# Patient Record
Sex: Female | Born: 1960 | Race: White | Hispanic: No | Marital: Married | Smoking: Never smoker
Health system: Southern US, Community
[De-identification: ages and names within clinical notes are randomized; demographics above are authoritative.]

## PROBLEM LIST (undated history)

## (undated) DIAGNOSIS — E882 Lipomatosis, not elsewhere classified: Secondary | ICD-10-CM

## (undated) HISTORY — PX: BREAST BIOPSY: SHX20

---

## 1997-12-28 ENCOUNTER — Emergency Department (HOSPITAL_COMMUNITY): Admission: EM | Admit: 1997-12-28 | Discharge: 1997-12-28 | Payer: Self-pay

## 1998-02-23 ENCOUNTER — Other Ambulatory Visit: Admission: RE | Admit: 1998-02-23 | Discharge: 1998-02-23 | Payer: Self-pay | Admitting: *Deleted

## 1998-08-09 ENCOUNTER — Inpatient Hospital Stay (HOSPITAL_COMMUNITY): Admission: AD | Admit: 1998-08-09 | Discharge: 1998-08-09 | Payer: Self-pay | Admitting: Obstetrics and Gynecology

## 1998-08-10 ENCOUNTER — Inpatient Hospital Stay (HOSPITAL_COMMUNITY): Admission: AD | Admit: 1998-08-10 | Discharge: 1998-08-12 | Payer: Self-pay | Admitting: Obstetrics and Gynecology

## 1999-02-12 ENCOUNTER — Other Ambulatory Visit: Admission: RE | Admit: 1999-02-12 | Discharge: 1999-02-12 | Payer: Self-pay | Admitting: Obstetrics and Gynecology

## 2000-01-10 ENCOUNTER — Ambulatory Visit (HOSPITAL_COMMUNITY): Admission: RE | Admit: 2000-01-10 | Discharge: 2000-01-10 | Payer: Self-pay | Admitting: Internal Medicine

## 2000-01-10 ENCOUNTER — Encounter: Payer: Self-pay | Admitting: Internal Medicine

## 2000-03-31 ENCOUNTER — Other Ambulatory Visit: Admission: RE | Admit: 2000-03-31 | Discharge: 2000-03-31 | Payer: Self-pay | Admitting: Obstetrics and Gynecology

## 2001-03-29 ENCOUNTER — Encounter: Payer: Self-pay | Admitting: Obstetrics and Gynecology

## 2001-03-29 ENCOUNTER — Ambulatory Visit (HOSPITAL_COMMUNITY): Admission: RE | Admit: 2001-03-29 | Discharge: 2001-03-29 | Payer: Self-pay | Admitting: Obstetrics and Gynecology

## 2001-04-21 ENCOUNTER — Encounter (INDEPENDENT_AMBULATORY_CARE_PROVIDER_SITE_OTHER): Payer: Self-pay

## 2001-04-21 ENCOUNTER — Observation Stay (HOSPITAL_COMMUNITY): Admission: AD | Admit: 2001-04-21 | Discharge: 2001-04-22 | Payer: Self-pay | Admitting: Obstetrics and Gynecology

## 2001-05-06 ENCOUNTER — Other Ambulatory Visit: Admission: RE | Admit: 2001-05-06 | Discharge: 2001-05-06 | Payer: Self-pay | Admitting: Obstetrics and Gynecology

## 2001-10-04 ENCOUNTER — Encounter: Payer: Self-pay | Admitting: Obstetrics and Gynecology

## 2001-10-04 ENCOUNTER — Ambulatory Visit (HOSPITAL_COMMUNITY): Admission: RE | Admit: 2001-10-04 | Discharge: 2001-10-04 | Payer: Self-pay | Admitting: Obstetrics and Gynecology

## 2001-10-06 ENCOUNTER — Encounter (INDEPENDENT_AMBULATORY_CARE_PROVIDER_SITE_OTHER): Payer: Self-pay

## 2001-10-06 ENCOUNTER — Inpatient Hospital Stay (HOSPITAL_COMMUNITY): Admission: AD | Admit: 2001-10-06 | Discharge: 2001-10-08 | Payer: Self-pay | Admitting: Obstetrics and Gynecology

## 2001-10-15 ENCOUNTER — Encounter: Payer: Self-pay | Admitting: Internal Medicine

## 2001-10-15 ENCOUNTER — Encounter: Admission: RE | Admit: 2001-10-15 | Discharge: 2001-10-15 | Payer: Self-pay | Admitting: Internal Medicine

## 2002-09-13 ENCOUNTER — Other Ambulatory Visit: Admission: RE | Admit: 2002-09-13 | Discharge: 2002-09-13 | Payer: Self-pay | Admitting: Obstetrics and Gynecology

## 2003-11-13 ENCOUNTER — Other Ambulatory Visit: Admission: RE | Admit: 2003-11-13 | Discharge: 2003-11-13 | Payer: Self-pay | Admitting: Obstetrics and Gynecology

## 2003-11-21 ENCOUNTER — Encounter: Admission: RE | Admit: 2003-11-21 | Discharge: 2003-11-21 | Payer: Self-pay | Admitting: Internal Medicine

## 2004-08-15 ENCOUNTER — Other Ambulatory Visit: Admission: RE | Admit: 2004-08-15 | Discharge: 2004-08-15 | Payer: Self-pay | Admitting: Obstetrics and Gynecology

## 2004-10-17 ENCOUNTER — Encounter: Admission: RE | Admit: 2004-10-17 | Discharge: 2004-10-17 | Payer: Self-pay | Admitting: Obstetrics and Gynecology

## 2005-03-19 ENCOUNTER — Other Ambulatory Visit: Admission: RE | Admit: 2005-03-19 | Discharge: 2005-03-19 | Payer: Self-pay | Admitting: Obstetrics and Gynecology

## 2005-03-24 ENCOUNTER — Ambulatory Visit (HOSPITAL_COMMUNITY): Admission: RE | Admit: 2005-03-24 | Discharge: 2005-03-24 | Payer: Self-pay | Admitting: General Surgery

## 2005-03-24 ENCOUNTER — Encounter (INDEPENDENT_AMBULATORY_CARE_PROVIDER_SITE_OTHER): Payer: Self-pay | Admitting: *Deleted

## 2006-06-03 ENCOUNTER — Emergency Department (HOSPITAL_COMMUNITY): Admission: EM | Admit: 2006-06-03 | Discharge: 2006-06-03 | Payer: Self-pay | Admitting: Emergency Medicine

## 2009-01-09 ENCOUNTER — Encounter: Admission: RE | Admit: 2009-01-09 | Discharge: 2009-01-09 | Payer: Self-pay | Admitting: Internal Medicine

## 2010-10-04 NOTE — Op Note (Signed)
[facility]  Patient:    Lindsey Dorsey, Lindsey Dorsey Visit Number: [id number] MRN: [id number]          Service Type: MED Location: 9300 [id number] Attending Physician:  Miguel Aschoff Dictated by:   Miguel Aschoff, M.D. Proc. Date: 10/06/01 Admit Date:  10/06/2001 Discharge Date: 10/08/2001                             Operative Report  PREOPERATIVE DIAGNOSIS:       Retained products of conception.  POSTOPERATIVE DIAGNOSIS:      Retained products of conception.  OPERATION:                    Suction curettage.  SURGEON:                      Miguel Aschoff, M.D.  ANESTHESIA:                   IV sedation and paracervical block.  COMPLICATIONS:                None.  INDICATIONS:                  The patient is a 50 year old white female gravida 9, para 6-1-1-6, who was approximately [redacted] weeks pregnant, and found to have a fetal demise.  The patient was admitted to undergo evacuation of the uterus using Cytotec.  This was successful with passage of the fetus. However, the placenta was partially retained, and the patient began having heavy bleeding with passage of large clots and loss of approximately one unit of blood.  Because of this, she is being taken now to undergo suction curettage to evacuate the uterus as the bleeding arrested.  DESCRIPTION OF PROCEDURE:     The patient was taken to the operating room and placed in the supine position.  IV sedation was administered without difficulty.  She was prepped and draped in the usual sterile fashion.  The bladder was catheterization.  A speculum was placed in the vaginal vault.  The residual clot in the vagina were removed without difficulty.  The cervix was identified and grasped with a tenaculum, and the cervix was injected with 18 cc of 1% Xylocaine by placing 6 cc at the 12, 4, and 8 oclock positions. The cervix was already widely open and a #10 vacuum curet was placed into the uterine cavity and contents were  removed without difficulty.  This yielded a moderate amount of retained placental tissue.  Following this, sharp curettage was carried out with only a small amount of additional tissue being obtained.  Then, a final pass was made with the vacuum curet.  With the uterus being empty, the procedure was completed.  The estimated blood loss for the procedure was approximately 75 cc.  However, the estimated blood loss from the hemorrhage due to the retained placenta prior to the OR was between 600-700 cc.  The patient will be observed overnight.  If she is stable, she is to be discharged to home. Dictated by:   Miguel Aschoff, M.D. Attending Physician:  Miguel Aschoff DD:  10/06/01 TD:  10/09/01 Job: [id number]

## 2010-10-04 NOTE — Discharge Summary (Signed)
[facility]  Patient:    Lindsey Dorsey, Lindsey Dorsey Visit Number: [id number] MRN: [id number]          Service Type: MED Location: [phone] Attending Physician:  Miguel Aschoff Dictated by:   Miguel Aschoff, M.D. Admit Date:  10/06/2001 Discharge Date: 10/08/2001                             Discharge Summary  ADMISSION DIAGNOSIS:             Fetal demise at 14 weeks.  OPERATIONS AND PROCEDURES:       Cytotec induction of labor followed by uterine curettage for routine products of conception.  HISTORY AND HOSPITAL COURSE:     The patient is a 50 year old white female gravida 80, para 6-0-2-6 with last menstrual period of June 24, 2001.  The patient was seen in the office and was noted to have no fetal heart activity. This was confirmed by ultrasound.  Her gestational age was 14 weeks and the patient was admitted to undergo Cytotec evacuation of the uterus for fetal demise.  The patient was admitted and  preoperative studies were obtained. This included admission hemoglobin of 12.5, hematocrit 36.7, white count of 7300.  Cytotec was used and the patient ultimately did pass the fetus without difficulty, however, the placenta was retained for more than 2 hours and in view of the onset of heavy vaginal bleeding it was elected to take the patient to the operating room to perform a D&C of the retained placenta.  This was done without difficulty.  The uterus was evacuated using suctions and curettage and the patient tolerated the procedure well.  The patient was started on antibiotic therapy and observed, there did not appear to be any renewed vaginal bleeding and by Oct 08, 2001 the patient was in satisfactory condition and was able to be discharged home.  DISCHARGE MEDICATIONS:           Medications for home included Motrin 400 mg every 4 hours as needed for pain.  DISCHARGE INSTRUCTIONS:          She is instructed to place nothing in the vagina and to call  if there are any problems such as fever, pain, or heavy bleeding.  LABORATORY DATA:                 The patients blood type was noted to be A positive.  The products of conception were sent for chromosome analysis.   The patient did undergo a TORCH titer and anticardiolipin studies to see if an etiology for the intrauterine fetal death could be determined.  These studies are pending. Dictated by:   Miguel Aschoff, M.D. Attending Physician:  Miguel Aschoff DD:  10/22/01 TD:  10/25/01 Job: [id number]

## 2010-10-04 NOTE — Op Note (Signed)
NAME:  Lindsey Dorsey, Lindsey Dorsey              ACCOUNT NO.:  [id number]   MEDICAL RECORD NO.:  [id number]          PATIENT TYPE:  AMB   LOCATION:  DAY                          FACILITY:  [facility]   PHYSICIAN:  Angelia Mould. 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   OPERATION PERFORMED:  Biopsy left triceps muscle with overlying skin and  subcutaneous adipose tissue.   SURGEON:  Angelia Mould. Derrell Lolling, M.D.   OPERATIVE INDICATIONS:  This is a 50 year old white female who has symptoms  of myalgias and multiple painful angiolipomas. She has been given the  diagnosis of Dercum's disease at [facility] dermatology clinic. She has  other rheumatologic symptoms. A consultation has been obtained with Dr.  Lyndon Code at the [facility]  in [place] [facility] who is concerned  that the patient may also have systemic rheumatologic disorder. She  requested a full-thickness biopsy of skin, subcutaneous tissue and muscle  from the triceps area. She requested either a punch biopsy or an excisional  biopsy. Dr. Chase Picket sent the patient to me for this biopsy. The patient  has been counseled as an outpatient and is brought to the operating room  electively.   OPERATIVE TECHNIQUE:  The patient was placed in modified right lateral  decubitus position with the left arm draped over a Mayo stand and secured.  The left arm was prepped from the elbow to the shoulder with Betadine in a  sterile fashion. Appropriate draping was placed. 1% Xylocaine with  epinephrine was used as a local infiltration anesthetic. I performed a 3-4  cm thin elliptical incision excising skin and subcutaneous tissue down to  the muscle fascia. The muscle fascia had no integrity at all. We excised  some of the underlying triceps muscle with it isolating this between  hemostats and excising the muscle. We sent the skin and subcutaneous tissue  and muscle wrapped in a moist saline gauze to the lab and I attached all the  appropriate paperwork with the specimen and discussed the case with Dr. Jimmy Picket. The muscle bundles were ligated with 3-0 Vicryl ties. The wound was  irrigated with saline. Hemostasis was excellent. The subcutaneous tissue was  closed with interrupted sutures of 3-0 Vicryl. The skin was closed with a  running subcuticular suture of 4-0 Vicryl and Steri-Strips. Clean bandages  were placed and the patient taken to the  recovery room in stable condition.  Estimated blood loss was about 5 mL. Complications none. Sponge, needle and  instrument counts were correct.      Angelia Mould. Derrell Lolling, M.D.  Electronically Signed    HMI/MEDQ  D:  03/24/2005  T:  03/25/2005  Job:  [id number]   cc:   Areatha Keas, M.D.  Fax: [phone]

## 2010-10-04 NOTE — Discharge Summary (Signed)
[facility]  Patient:    [name], Lindsey Dorsey Visit Number: [id number] MRN: [id number]          Service Type: OBS Location: [phone] Attending Physician:  Osborn Coho Dictated by:   Gerrit Friends. Aldona Bar, M.D. Admit Date:  04/21/2001 Discharge Date: 04/22/2001                             Discharge Summary  DISCHARGE DIAGNOSIS:          Fetal demise of a 16-week intrauterine pregnancy                               likely secondary to a cord accident.  PROCEDURES:                   Cytotec induction and delivery.  SUMMARY:                      This 50 year old gravida 8, para 5, at 16 weeks, was admitted for Cytotec induction secondary to fetal demise.  Her pregnancy was complicated by advanced maternal age and she did have an amniocentesis approximately two weeks ago.  The amniocentesis went without complications. Patient noted no fetal movement over the two days prior to this admission and ultrasound confirmed fetal demise.  The genotype of the fetus was 28 XY.  Patient was admitted and underwent placement of Cytotec, 200 mg per vagina x 2 -- four hours apart.  She delivered the placental sac and baby intact.  There were no tears.  There was no heavy bleeding.  The sac was opened and the amniotic fluid was noted to be brownish in color.  The baby had a triple nuchal cord which was tightly wrapped around the neck, twisted many time.  The placenta appeared normal.  On the morning of April 22, 2001, the patient was stable, although somewhat emotional.  She was given a prescription for Darvocet-N 100 to take every four hours as needed for pain or headache and was told to return to the office in two weeks time or as needed and was given careful explicit instructions at the time of discharge.  Patients discharge hemoglobin was noted to be 10.7 with a white count of 16,200 and a platelet count of 212,000.  Rh factor is not noted on the chart but  specifically an order has been written that if the patient is Rh-negative, RhoGAM will be given.  CONDITION ON DISCHARGE:       Improved. Dictated by:   Gerrit Friends. Aldona Bar, M.D. Attending Physician:  Osborn Coho DD:  04/22/01 TD:  04/22/01 Job: [id number]

## 2011-03-31 ENCOUNTER — Other Ambulatory Visit: Payer: Self-pay | Admitting: Obstetrics and Gynecology

## 2011-04-08 ENCOUNTER — Other Ambulatory Visit: Payer: Self-pay | Admitting: Obstetrics and Gynecology

## 2011-04-08 DIAGNOSIS — R928 Other abnormal and inconclusive findings on diagnostic imaging of breast: Secondary | ICD-10-CM

## 2011-04-22 ENCOUNTER — Other Ambulatory Visit: Payer: Self-pay | Admitting: Obstetrics and Gynecology

## 2011-04-22 ENCOUNTER — Ambulatory Visit
Admission: RE | Admit: 2011-04-22 | Discharge: 2011-04-22 | Disposition: A | Discharge status: Home / Self Care | Payer: BC Managed Care – PPO | Source: Ambulatory Visit | Attending: Obstetrics and Gynecology | Admitting: Obstetrics and Gynecology

## 2011-04-22 DIAGNOSIS — R928 Other abnormal and inconclusive findings on diagnostic imaging of breast: Secondary | ICD-10-CM

## 2011-05-02 ENCOUNTER — Ambulatory Visit
Admission: RE | Admit: 2011-05-02 | Discharge: 2011-05-02 | Disposition: A | Discharge status: Home / Self Care | Payer: BC Managed Care – PPO | Source: Ambulatory Visit | Attending: Obstetrics and Gynecology | Admitting: Obstetrics and Gynecology

## 2011-05-02 DIAGNOSIS — R928 Other abnormal and inconclusive findings on diagnostic imaging of breast: Secondary | ICD-10-CM

## 2011-05-06 ENCOUNTER — Other Ambulatory Visit: Payer: Self-pay | Admitting: Internal Medicine

## 2011-05-06 DIAGNOSIS — IMO0002 Reserved for concepts with insufficient information to code with codable children: Secondary | ICD-10-CM

## 2011-05-07 ENCOUNTER — Ambulatory Visit
Admission: RE | Admit: 2011-05-07 | Discharge: 2011-05-07 | Disposition: A | Discharge status: Home / Self Care | Payer: BC Managed Care – PPO | Source: Ambulatory Visit | Attending: Internal Medicine | Admitting: Internal Medicine

## 2011-05-07 DIAGNOSIS — IMO0002 Reserved for concepts with insufficient information to code with codable children: Secondary | ICD-10-CM

## 2012-01-31 ENCOUNTER — Encounter (HOSPITAL_BASED_OUTPATIENT_CLINIC_OR_DEPARTMENT_OTHER): Payer: Self-pay | Admitting: *Deleted

## 2012-01-31 ENCOUNTER — Emergency Department (HOSPITAL_BASED_OUTPATIENT_CLINIC_OR_DEPARTMENT_OTHER)
Admission: EM | Admit: 2012-01-31 | Discharge: 2012-01-31 | Disposition: A | Discharge status: Home / Self Care | Payer: Self-pay | Attending: Emergency Medicine | Admitting: Emergency Medicine

## 2012-01-31 DIAGNOSIS — E7889 Other lipoprotein metabolism disorders: Secondary | ICD-10-CM | POA: Insufficient documentation

## 2012-01-31 DIAGNOSIS — K047 Periapical abscess without sinus: Secondary | ICD-10-CM

## 2012-01-31 DIAGNOSIS — K029 Dental caries, unspecified: Secondary | ICD-10-CM | POA: Insufficient documentation

## 2012-01-31 HISTORY — DX: Lipomatosis, not elsewhere classified: E88.2

## 2012-01-31 MED ORDER — METRONIDAZOLE 500 MG PO TABS: 500.0000 mg | ORAL_TABLET | Freq: Three times a day (TID) | ORAL | Status: AC

## 2012-01-31 MED ORDER — PENICILLIN V POTASSIUM 250 MG PO TABS: 250.0000 mg | ORAL_TABLET | Freq: Four times a day (QID) | ORAL | Status: AC

## 2012-01-31 MED ORDER — IBUPROFEN 200 MG PO TABS
600.0000 mg | ORAL_TABLET | Freq: Once | ORAL | Status: AC
  Administered 2012-01-31: 600 mg via ORAL
  Filled 2012-01-31: qty 1

## 2012-01-31 NOTE — ED Provider Notes (Signed)
History  This chart was scribed for Lindsey Kaplan, MD by Shari Heritage. The patient was seen in room MH09/MH09. Patient's care was started at 1747.     CSN: [id number]  Arrival date & time 01/31/12  1723   First MD Initiated Contact with Patient 01/31/12 1747      Chief Complaint  Patient presents with  . Dental Pain    Patient is a 51 y.o. female presenting with tooth pain. The history is provided by the patient. No language interpreter was used.  Dental PainThe primary symptoms include mouth pain, dental injury and headaches. The symptoms began more than 1 week ago. The symptoms are worsening. The symptoms are recurrent. The symptoms occur constantly.  Mouth pain began more than 1 week ago. Mouth pain occurs constantly. Mouth pain is worsening. Affected locations include: teeth.  The injury is a fracture.  Additional symptoms include: ear pain. Medical issues do not include: smoking.    Lindsey Dorsey is a 51 y.o. female who presents to the Emergency Department complaining of moderate to severe, constant right lower dental pain onset 2 weeks ago when patient broke a tooth while eating.  Associated symptoms include mild neck pain, HA and right otalgia. Patient says that pain began to worsen significantly on Sunday. Pain is aggravated with cold and eating. Patient says that she put garlic on her tooth and has been taking pain medication with mild relief. Patient has a history of Dercum's disease (angiolypomas) and breast biopsy. She has never smoked.    Past Medical History  Diagnosis Date  . Dercum's disease     Past Surgical History  Procedure Date  . Breast biopsy     History reviewed. No pertinent family history.  History  Substance Use Topics  . Smoking status: Never Smoker   . Smokeless tobacco: Not on file  . Alcohol Use: No    OB History    Grav Para Term Preterm Abortions TAB SAB Ect Mult Living                  Review of Systems  HENT: Positive for  ear pain and neck pain.   Neurological: Positive for headaches.  All other systems reviewed and are negative.    Allergies  Review of patient's allergies indicates no known allergies.  Home Medications  No current outpatient prescriptions on file.  BP 156/83  Pulse 70  Temp 98.2 F (36.8 C) (Oral)  Resp 20  Ht 5' 3.5" (1.613 m)  Wt 130 lb (58.968 kg)  BMI 22.67 kg/m2  SpO2 100%  LMP 11/30/2011  Physical Exam  Nursing note and vitals reviewed. Constitutional: She is oriented to person, place, and time. She appears well-developed and well-nourished.  HENT:  Head: Normocephalic and atraumatic. No trismus in the jaw.  Mouth/Throat: Uvula is midline. Abnormal dentition. Dental caries present.       Multiple decayed teeth. Several teeth are missing.  Gingiva exam: No fluctuance. No erythema. No crepitus. Mild tenderness around tooth #__. No trismus. Mild gingival swelling present.  Eyes: EOM are normal. Pupils are equal, round, and reactive to light.  Neck: Normal range of motion. Neck supple.  Cardiovascular: Normal rate and regular rhythm.   No murmur heard. Pulmonary/Chest: Effort normal and breath sounds normal. No respiratory distress. She has no wheezes. She has no rales.       Lungs clear to auscultation.  Musculoskeletal: Normal range of motion.  Neurological: She is alert and oriented to person, place,  and time.  Skin: Skin is warm and dry.  Psychiatric: She has a normal mood and affect. Her behavior is normal.    ED Course  Procedures (including critical care time) DIAGNOSTIC STUDIES: Oxygen Saturation is 100% on room air, normal by my interpretation.    COORDINATION OF CARE: 6:17pm- Patient informed of current plan for treatment and evaluation and agrees with plan at this time.      Labs Reviewed - No data to display No results found.   No diagnosis found.    MDM  Medical screening examination/treatment/procedure(s) were performed by me as the  supervising physician. Scribe service was utilized for documentation only.  Pt comes in with cc of dental pain. She has pretty significant dental disease, and based on hx and exam this appears to be a periapical/peridontal disease process. AB given, and prompt followup with Dentist stressed. Resources will be provided with the d/c summary.   Lindsey Kaplan, MD 01/31/12 [phone]

## 2012-01-31 NOTE — ED Notes (Signed)
Dental pain x 2 weeks. Saw Dr. And given antibiotic. Told to f/u with dentist, but unable to. Needs referral to clinic.

## 2012-09-29 DIAGNOSIS — F3289 Other specified depressive episodes: Secondary | ICD-10-CM | POA: Insufficient documentation

## 2012-09-29 DIAGNOSIS — F4325 Adjustment disorder with mixed disturbance of emotions and conduct: Secondary | ICD-10-CM | POA: Insufficient documentation

## 2012-09-29 DIAGNOSIS — Z79899 Other long term (current) drug therapy: Secondary | ICD-10-CM | POA: Insufficient documentation

## 2012-09-29 DIAGNOSIS — Z8639 Personal history of other endocrine, nutritional and metabolic disease: Secondary | ICD-10-CM | POA: Insufficient documentation

## 2012-09-29 DIAGNOSIS — R45851 Suicidal ideations: Secondary | ICD-10-CM | POA: Insufficient documentation

## 2012-09-29 DIAGNOSIS — Z862 Personal history of diseases of the blood and blood-forming organs and certain disorders involving the immune mechanism: Secondary | ICD-10-CM | POA: Insufficient documentation

## 2012-09-29 DIAGNOSIS — F329 Major depressive disorder, single episode, unspecified: Secondary | ICD-10-CM | POA: Insufficient documentation

## 2012-09-30 ENCOUNTER — Emergency Department (HOSPITAL_BASED_OUTPATIENT_CLINIC_OR_DEPARTMENT_OTHER)
Admission: EM | Admit: 2012-09-30 | Discharge: 2012-09-30 | Disposition: A | Discharge status: Home / Self Care | Payer: Self-pay | Attending: Emergency Medicine | Admitting: Emergency Medicine

## 2012-09-30 ENCOUNTER — Encounter (HOSPITAL_BASED_OUTPATIENT_CLINIC_OR_DEPARTMENT_OTHER): Payer: Self-pay | Admitting: *Deleted

## 2012-09-30 DIAGNOSIS — F4325 Adjustment disorder with mixed disturbance of emotions and conduct: Secondary | ICD-10-CM

## 2012-09-30 DIAGNOSIS — F329 Major depressive disorder, single episode, unspecified: Secondary | ICD-10-CM

## 2012-09-30 DIAGNOSIS — R45851 Suicidal ideations: Secondary | ICD-10-CM

## 2012-09-30 LAB — CBC WITH DIFFERENTIAL/PLATELET
Basophils Relative: 0 % (ref 0–1)
Eosinophils Absolute: 0.1 10*3/uL (ref 0.0–0.7)
Eosinophils Relative: 1 % (ref 0–5)
Hemoglobin: 13.7 g/dL (ref 12.0–15.0)
Lymphocytes Relative: 38 % (ref 12–46)
Lymphs Abs: 3.1 10*3/uL (ref 0.7–4.0)
MCH: 29.7 pg (ref 26.0–34.0)
MCHC: 34.7 g/dL (ref 30.0–36.0)
Neutro Abs: 4 10*3/uL (ref 1.7–7.7)
RBC: 4.62 MIL/uL (ref 3.87–5.11)

## 2012-09-30 LAB — RAPID URINE DRUG SCREEN, HOSP PERFORMED
Amphetamines: NOT DETECTED
Barbiturates: NOT DETECTED
Benzodiazepines: POSITIVE — AB
Cocaine: NOT DETECTED
Opiates: POSITIVE — AB

## 2012-09-30 LAB — COMPREHENSIVE METABOLIC PANEL
AST: 24 U/L (ref 0–37)
Albumin: 4.5 g/dL (ref 3.5–5.2)
GFR calc Af Amer: >90 mL/min (ref >90)
GFR calc non Af Amer: 85 mL/min — ABNORMAL LOW (ref >90)
Glucose, Bld: 102 mg/dL — ABNORMAL HIGH (ref 70–99)
Total Bilirubin: 0.9 mg/dL (ref 0.3–1.2)
Total Protein: 7.6 g/dL (ref 6.0–8.3)

## 2012-09-30 LAB — URINALYSIS, ROUTINE W REFLEX MICROSCOPIC
Bilirubin Urine: NEGATIVE
Glucose, UA: NEGATIVE mg/dL
Hgb urine dipstick: NEGATIVE
Ketones, ur: 15 mg/dL — AB
Nitrite: NEGATIVE
Protein, ur: 30 mg/dL — AB
Specific Gravity, Urine: 1.025 (ref 1.005–1.030)
Urobilinogen, UA: 0.2 mg/dL (ref 0.0–1.0)
pH: 5.5 (ref 5.0–8.0)

## 2012-09-30 LAB — URINE MICROSCOPIC-ADD ON

## 2012-09-30 MED ORDER — ALPRAZOLAM 0.5 MG PO TABS
0.5000 mg | ORAL_TABLET | Freq: Two times a day (BID) | ORAL | Status: DC | PRN
  Administered 2012-09-30 (×2): 0.5 mg via ORAL
  Filled 2012-09-30 (×2): qty 1

## 2012-09-30 MED ORDER — IBUPROFEN 600 MG PO TABS: 600.0000 mg | ORAL_TABLET | Freq: Three times a day (TID) | ORAL | Status: DC | PRN

## 2012-09-30 MED ORDER — HYDROCODONE-ACETAMINOPHEN 5-325 MG PO TABS
ORAL_TABLET | ORAL | Status: AC
  Administered 2012-09-30: 1 via ORAL
  Filled 2012-09-30: qty 1

## 2012-09-30 MED ORDER — HYDROCODONE-ACETAMINOPHEN 5-325 MG PO TABS: 1.0000 | ORAL_TABLET | Freq: Once | ORAL | Status: AC

## 2012-09-30 MED ORDER — ONDANSETRON HCL 4 MG PO TABS: 4.0000 mg | ORAL_TABLET | Freq: Three times a day (TID) | ORAL | Status: DC | PRN

## 2012-09-30 MED ORDER — LORAZEPAM 1 MG PO TABS: 1.0000 mg | ORAL_TABLET | Freq: Three times a day (TID) | ORAL | Status: DC | PRN

## 2012-09-30 MED ORDER — HYDROCODONE-ACETAMINOPHEN 5-325 MG PO TABS
1.0000 | ORAL_TABLET | Freq: Once | ORAL | Status: AC
  Administered 2012-09-30: 1 via ORAL
  Filled 2012-09-30: qty 1

## 2012-09-30 MED ORDER — ZOLPIDEM TARTRATE 5 MG PO TABS
5.0000 mg | ORAL_TABLET | Freq: Every evening | ORAL | Status: DC | PRN
  Filled 2012-09-30: qty 1

## 2012-09-30 MED ORDER — ALUM & MAG HYDROXIDE-SIMETH 200-200-20 MG/5ML PO SUSP: 30.0000 mL | ORAL | Status: DC | PRN

## 2012-09-30 MED ORDER — ACETAMINOPHEN 325 MG PO TABS: 650.0000 mg | ORAL_TABLET | ORAL | Status: DC | PRN

## 2012-09-30 NOTE — ED Notes (Signed)
Pt has multiple complaints, states she is uncomfortable and has not had a shower. Pt offered breakfast, declines, given toothbrush and toothpaste. [facility] phoned again. Left message to call me back.

## 2012-09-30 NOTE — ED Notes (Signed)
Call placed to [facility] regarding ETA, 15 mins per counselor. Pt made aware of plan of care.

## 2012-09-30 NOTE — ED Notes (Signed)
rec'd call from counselor with [facility] who states that are en route.

## 2012-09-30 NOTE — ED Notes (Signed)
Call placed to [facility] per MD order.
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
## 2012-09-30 NOTE — ED Notes (Signed)
Pt daughter "Florentina Addison" calls charge nurse phone to inquire about pt. Pt cont speaking with Sue Lush from [facility]. Pt requests that I tell her daughter that she is ok, and that she will call her back later. Daughter Florentina Addison given message.
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
## 2012-09-30 NOTE — ED Notes (Signed)
Pt care assumed. Pt sleeping with resp deep and regular, sitter at bedside.

## 2012-09-30 NOTE — ED Notes (Signed)
MD at bedside. 

## 2012-09-30 NOTE — ED Notes (Signed)
Pt states that she would like to opt out at this time so that no one knows where she is at and no one is to be given any info related to her treatment. Registration made aware of request.

## 2012-09-30 NOTE — ED Notes (Signed)
[facility] called again by this rn, again dispatcher states that a social worker will call me back.

## 2012-09-30 NOTE — ED Notes (Signed)
[facility] at bedside speaking with pt., sitter remains at bedside.

## 2012-09-30 NOTE — ED Notes (Signed)
Pt requesting meds for her anxiety and to help her rest, pt states that she normally takes Xanax 0.5 mg 2 times per day, MD made aware and new order rec'd.

## 2012-09-30 NOTE — ED Notes (Signed)
Tele-psych consult completed.   

## 2012-09-30 NOTE — ED Notes (Signed)
Pt agrees to voluntary inpt placement, counselor working to find placement. Pt calm and cooperative, no complaints.

## 2012-09-30 NOTE — ED Notes (Signed)
Felicia from [facility] at bs, pt assessed.

## 2012-09-30 NOTE — ED Notes (Signed)
This rn speaks with "Bruce" at [facility], who states she is on his list for follow up this morning. Bruce states that they are very busy and someone will be here to see her by noon today. Pt updated on plan of care.

## 2012-09-30 NOTE — ED Notes (Signed)
Pt calm and cooperative at this time, PO fluids given.

## 2012-09-30 NOTE — ED Notes (Signed)
Pt states " i want to kill myself" pt has plan.

## 2012-09-30 NOTE — B[facility] Counselor (Signed)
Writer contacted mobile crises worker-Bruce @[phone] and made him aware that patient was transferred to [facility]. Bruce was also made aware that a tele psych consult would be initiated to assist in determining further dis positioning. Bruce will follow up in 30 minutes to an hour to inquire about the recommendations from tele psych.

## 2012-09-30 NOTE — ED Notes (Signed)
MD Preston Fleeting has spoken to patient as has [facility] regarding the importance of remaining here on a voluntary basis and MD is working to get her transferred to [facility]

## 2012-09-30 NOTE — ED Notes (Signed)
Mobile crisis remains at bedside, pt alert and cooperative.
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
## 2014-01-02 ENCOUNTER — Other Ambulatory Visit: Payer: Self-pay | Admitting: Obstetrics and Gynecology

## 2014-01-04 LAB — CYTOLOGY - PAP

## 2014-05-29 ENCOUNTER — Encounter (HOSPITAL_BASED_OUTPATIENT_CLINIC_OR_DEPARTMENT_OTHER): Payer: Self-pay

## 2014-05-29 ENCOUNTER — Emergency Department (HOSPITAL_BASED_OUTPATIENT_CLINIC_OR_DEPARTMENT_OTHER)
Admission: EM | Admit: 2014-05-29 | Discharge: 2014-05-29 | Disposition: A | Discharge status: Home / Self Care | Payer: No Typology Code available for payment source | Attending: Emergency Medicine | Admitting: Emergency Medicine

## 2014-05-29 ENCOUNTER — Emergency Department (HOSPITAL_BASED_OUTPATIENT_CLINIC_OR_DEPARTMENT_OTHER): Payer: No Typology Code available for payment source

## 2014-05-29 DIAGNOSIS — Z7951 Long term (current) use of inhaled steroids: Secondary | ICD-10-CM | POA: Insufficient documentation

## 2014-05-29 DIAGNOSIS — S199XXA Unspecified injury of neck, initial encounter: Secondary | ICD-10-CM | POA: Diagnosis not present

## 2014-05-29 DIAGNOSIS — Z79899 Other long term (current) drug therapy: Secondary | ICD-10-CM | POA: Diagnosis not present

## 2014-05-29 DIAGNOSIS — S29011A Strain of muscle and tendon of front wall of thorax, initial encounter: Secondary | ICD-10-CM | POA: Insufficient documentation

## 2014-05-29 DIAGNOSIS — S29001A Unspecified injury of muscle and tendon of front wall of thorax, initial encounter: Secondary | ICD-10-CM | POA: Diagnosis present

## 2014-05-29 DIAGNOSIS — Y92410 Unspecified street and highway as the place of occurrence of the external cause: Secondary | ICD-10-CM | POA: Insufficient documentation

## 2014-05-29 DIAGNOSIS — Y9389 Activity, other specified: Secondary | ICD-10-CM | POA: Diagnosis not present

## 2014-05-29 DIAGNOSIS — Y998 Other external cause status: Secondary | ICD-10-CM | POA: Insufficient documentation

## 2014-05-29 DIAGNOSIS — Z041 Encounter for examination and observation following transport accident: Secondary | ICD-10-CM

## 2014-05-29 DIAGNOSIS — Z8639 Personal history of other endocrine, nutritional and metabolic disease: Secondary | ICD-10-CM | POA: Insufficient documentation

## 2014-05-29 DIAGNOSIS — Z043 Encounter for examination and observation following other accident: Secondary | ICD-10-CM

## 2014-05-29 DIAGNOSIS — S8991XA Unspecified injury of right lower leg, initial encounter: Secondary | ICD-10-CM | POA: Insufficient documentation

## 2014-05-29 MED ORDER — CYCLOBENZAPRINE HCL 5 MG PO TABS: 5.0000 mg | ORAL_TABLET | Freq: Three times a day (TID) | ORAL | Status: AC | PRN
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
## 2014-05-29 NOTE — ED Notes (Signed)
MD at bedside. 
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
## 2014-06-07 ENCOUNTER — Emergency Department (HOSPITAL_BASED_OUTPATIENT_CLINIC_OR_DEPARTMENT_OTHER): Payer: No Typology Code available for payment source

## 2014-06-07 ENCOUNTER — Emergency Department (HOSPITAL_BASED_OUTPATIENT_CLINIC_OR_DEPARTMENT_OTHER)
Admission: EM | Admit: 2014-06-07 | Discharge: 2014-06-07 | Disposition: A | Discharge status: Home / Self Care | Payer: No Typology Code available for payment source | Attending: Emergency Medicine | Admitting: Emergency Medicine

## 2014-06-07 ENCOUNTER — Encounter (HOSPITAL_BASED_OUTPATIENT_CLINIC_OR_DEPARTMENT_OTHER): Payer: Self-pay | Admitting: *Deleted

## 2014-06-07 DIAGNOSIS — Z8639 Personal history of other endocrine, nutritional and metabolic disease: Secondary | ICD-10-CM | POA: Insufficient documentation

## 2014-06-07 DIAGNOSIS — Y998 Other external cause status: Secondary | ICD-10-CM | POA: Insufficient documentation

## 2014-06-07 DIAGNOSIS — R52 Pain, unspecified: Secondary | ICD-10-CM

## 2014-06-07 DIAGNOSIS — S82444A Nondisplaced spiral fracture of shaft of right fibula, initial encounter for closed fracture: Secondary | ICD-10-CM | POA: Diagnosis not present

## 2014-06-07 DIAGNOSIS — S8991XA Unspecified injury of right lower leg, initial encounter: Secondary | ICD-10-CM | POA: Diagnosis present

## 2014-06-07 DIAGNOSIS — Z79899 Other long term (current) drug therapy: Secondary | ICD-10-CM | POA: Insufficient documentation

## 2014-06-07 DIAGNOSIS — Y9389 Activity, other specified: Secondary | ICD-10-CM | POA: Diagnosis not present

## 2014-06-07 DIAGNOSIS — R609 Edema, unspecified: Secondary | ICD-10-CM

## 2014-06-07 DIAGNOSIS — Y92410 Unspecified street and highway as the place of occurrence of the external cause: Secondary | ICD-10-CM | POA: Insufficient documentation

## 2014-06-07 DIAGNOSIS — S82401A Unspecified fracture of shaft of right fibula, initial encounter for closed fracture: Secondary | ICD-10-CM

## 2014-06-07 DIAGNOSIS — Z7951 Long term (current) use of inhaled steroids: Secondary | ICD-10-CM | POA: Insufficient documentation

## 2014-06-07 DIAGNOSIS — T1490XA Injury, unspecified, initial encounter: Secondary | ICD-10-CM

## 2014-06-07 NOTE — ED Notes (Signed)
Pt states she has crutches at home.

## 2014-06-07 NOTE — Discharge Instructions (Signed)
Cast or Splint Care °Casts and splints support injured limbs and keep bones from moving while they heal. It is important to care for your cast or splint at home.   °HOME CARE INSTRUCTIONS °· Keep the cast or splint uncovered during the drying period. It can take 24 to 48 hours to dry if it is made of plaster. A fiberglass cast will dry in less than 1 hour. °· Do not rest the cast on anything harder than a pillow for the first 24 hours. °· Do not put weight on your injured limb or apply pressure to the cast until your health care provider gives you permission. °· Keep the cast or splint dry. Wet casts or splints can lose their shape and may not support the limb as well. A wet cast that has lost its shape can also create harmful pressure on your skin when it dries. Also, wet skin can become infected. °¨ Cover the cast or splint with a plastic bag when bathing or when out in the rain or snow. If the cast is on the trunk of the body, take sponge baths until the cast is removed. °¨ If your cast does become wet, dry it with a towel or a blow dryer on the cool setting only. °· Keep your cast or splint clean. Soiled casts may be wiped with a moistened cloth. °· Do not place any hard or soft foreign objects under your cast or splint, such as cotton, toilet paper, lotion, or powder. °· Do not try to scratch the skin under the cast with any object. The object could get stuck inside the cast. Also, scratching could lead to an infection. If itching is a problem, use a blow dryer on a cool setting to relieve discomfort. °· Do not trim or cut your cast or remove padding from inside of it. °· Exercise all joints next to the injury that are not immobilized by the cast or splint. For example, if you have a long leg cast, exercise the hip joint and toes. If you have an arm cast or splint, exercise the shoulder, elbow, thumb, and fingers. °· Elevate your injured arm or leg on 1 or 2 pillows for the first 1 to 3 days to decrease  swelling and pain. It is best if you can comfortably elevate your cast so it is higher than your heart. °SEEK MEDICAL CARE IF:  °· Your cast or splint cracks. °· Your cast or splint is too tight or too loose. °· You have unbearable itching inside the cast. °· Your cast becomes wet or develops a soft spot or area. °· You have a bad smell coming from inside your cast. °· You get an object stuck under your cast. °· Your skin around the cast becomes red or raw. °· You have new pain or worsening pain after the cast has been applied. °SEEK IMMEDIATE MEDICAL CARE IF:  °· You have fluid leaking through the cast. °· You are unable to move your fingers or toes. °· You have discolored (blue or white), cool, painful, or very swollen fingers or toes beyond the cast. °· You have tingling or numbness around the injured area. °· You have severe pain or pressure under the cast. °· You have any difficulty with your breathing or have shortness of breath. °· You have chest pain. °Document Released: 05/02/2000 Document Revised: 02/23/2013 Document Reviewed: 11/11/2012 °ExitCare® Patient Information ©2015 [employer]. This information is not intended to replace advice given to you by your health care   provider. Make sure you discuss any questions you have with your health care provider. ° °Fibular Fracture, Ankle, Adult, Undisplaced, Treated With Immobilization °A simple fracture of the bone below the knee on the outside of your leg (fibula) usually heals without problems. °CAUSES °Typically, a fibular fracture occurs as a result of trauma. A blow to the side of your leg or a powerful twisting movement can cause a fracture. Fibular fractures are often seen as a result of football, soccer, or skiing injuries. °SYMPTOMS °Symptoms of a fibular fracture can include: °· Pain. °· Shortening or abnormal alignment of your lower leg (angulation). °DIAGNOSIS °A health care provider will need to examine the leg. X-ray exams will be ordered for  further to confirm the fracture and evaluate the extent and of the injury. °TREATMENT  °Typically, a cast or immobilizer is applied. Sometimes a splint is placed on these fractures if it is needed for comfort or if the bones are badly out of place. Crutches may be needed to help you get around.  °HOME CARE INSTRUCTIONS  °· Apply ice to the injured area: °¨ Put ice in a plastic bag. °¨ Place a towel between your skin and the bag. °¨ Leave the ice on for 20 minutes, 2-3 times a day. °· Use crutches as directed. Resume walking without crutches as directed by your health care provider or when comfortable doing so. °· Only take over-the-counter or prescription medicines for pain, discomfort, or fever as directed by your health care provider. °· Keeping your leg raised may lessen swelling. °· If you have a removable splint or boot, do not remove the boot unless directed by your health care provider. °· Do not not drive a car or operate a motor vehicle until your health care provider specifically tells you it is safe to do so. °SEEK IMMEDIATE MEDICAL CARE IF:  °· Your cast gets damaged or breaks. °· You have continued severe pain or more swelling than you did before the cast was put on, or the pain is not controlled with medications. °· Your skin or nails below the injury turn blue or grey, or feel cold or numb. °· There is a bad smell or pus coming from under the cast. °· You develop severe pain in ankle or foot. °MAKE SURE YOU:  °· Understand these instructions. °· Will watch your condition. °· Will get help right away if you are not doing well or get worse. °Document Released: 01/25/2002 Document Revised: 02/23/2013 Document Reviewed: 12/15/2012 °ExitCare® Patient Information ©2015 [employer]. This information is not intended to replace advice given to you by your health care provider. Make sure you discuss any questions you have with your health care provider. ° °
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
## 2015-06-07 ENCOUNTER — Other Ambulatory Visit: Payer: Self-pay | Admitting: Internal Medicine

## 2015-06-07 ENCOUNTER — Ambulatory Visit
Admission: RE | Admit: 2015-06-07 | Discharge: 2015-06-07 | Disposition: A | Discharge status: Home / Self Care | Payer: No Typology Code available for payment source | Source: Ambulatory Visit | Attending: Internal Medicine | Admitting: Internal Medicine

## 2015-06-07 DIAGNOSIS — W19XXXA Unspecified fall, initial encounter: Secondary | ICD-10-CM

## 2021-10-03 ENCOUNTER — Ambulatory Visit
Admission: RE | Admit: 2021-10-03 | Discharge: 2021-10-03 | Disposition: A | Discharge status: Home / Self Care | Payer: Commercial Managed Care - HMO | Source: Ambulatory Visit | Attending: Internal Medicine | Admitting: Internal Medicine

## 2021-10-03 ENCOUNTER — Other Ambulatory Visit: Payer: Self-pay | Admitting: Internal Medicine

## 2021-10-03 DIAGNOSIS — M25471 Effusion, right ankle: Secondary | ICD-10-CM

## 2022-03-09 ENCOUNTER — Telehealth: Payer: Self-pay

## 2022-03-09 ENCOUNTER — Ambulatory Visit
Admission: EM | Admit: 2022-03-09 | Discharge: 2022-03-09 | Disposition: A | Discharge status: Home / Self Care | Payer: Commercial Managed Care - HMO | Attending: Physician Assistant | Admitting: Physician Assistant

## 2022-03-09 DIAGNOSIS — J0140 Acute pansinusitis, unspecified: Secondary | ICD-10-CM | POA: Diagnosis not present

## 2022-03-09 MED ORDER — AMOXICILLIN-POT CLAVULANATE 875-125 MG PO TABS: 1.0000 | ORAL_TABLET | Freq: Two times a day (BID) | ORAL | 0 refills | Status: AC

## 2022-03-09 MED ORDER — AMOXICILLIN-POT CLAVULANATE 875-125 MG PO TABS: 1.0000 | ORAL_TABLET | Freq: Two times a day (BID) | ORAL | 0 refills | Status: DC

## 2022-03-09 NOTE — ED Triage Notes (Signed)
Pt c/o headache, sinus pressure, chills, and rash to abd, upper right shoulder blade and inner thigh.  Started: about 1.5 weeks ago  Home interventions: previously prescribed penicillin

## 2022-03-09 NOTE — ED Provider Notes (Signed)
[facility]    CSN: [id number] Arrival date & time: 03/09/22  1103      History   Chief Complaint Chief Complaint  Patient presents with   Headache   Chills   Rash    HPI Lindsey Dorsey is a 61 y.o. female.   Patient here today for evaluation of headache, sinus pressure, chills and rash.  She reports that symptoms started about a week and a half ago.  She has tried over-the-counter medication as well as penicillin without significant relief.  She denies any vomiting or diarrhea.  She does report some ear pain and drainage throat.  The history is provided by the patient.    Past Medical History:  Diagnosis Date   Dercum's disease     There are no problems to display for this patient.   Past Surgical History:  Procedure Laterality Date   BREAST BIOPSY      OB History   No obstetric history on file.      Home Medications    Prior to Admission medications   Medication Sig Start Date End Date Taking? Authorizing Provider  ALPRAZolam Duanne Moron) 0.5 MG tablet Take 0.5 mg by mouth at bedtime as needed for anxiety.     [provider]  amoxicillin-clavulanate (AUGMENTIN) 875-125 MG tablet Take 1 tablet by mouth every 12 (twelve) hours. 03/09/22   Francene Finders, PA-C  cyclobenzaprine (FLEXERIL) 5 MG tablet Take 1 tablet (5 mg total) by mouth 3 (three) times daily as needed for muscle spasms. 05/29/14   Quintella Reichert, MD  fluticasone [facility]) 50 MCG/ACT nasal spray Place into both nostrils daily.    [provider]  HYDROcodone-acetaminophen (VICODIN ES) 7.5-750 MG per tablet Take 0.5-1 tablets by mouth every 6 (six) hours as needed for pain.     [provider]  loratadine (CLARITIN) 10 MG tablet Take 10 mg by mouth daily as needed for allergies.    [provider]  methylPREDNISolone (MEDROL) 4 MG tablet Take 2 mg by mouth daily.     [provider]  Multiple Vitamin (MULTIVITAMIN WITH MINERALS) TABS Take 1  tablet by mouth daily.    [provider]  zolpidem (AMBIEN) 10 MG tablet Take 10 mg by mouth at bedtime as needed for sleep.     [provider]    Family History History reviewed. No pertinent family history.  Social History Social History   Tobacco Use   Smoking status: Never  Substance Use Topics   Alcohol use: No   Drug use: No     Allergies   Patient has no known allergies.   Review of Systems Review of Systems  Constitutional:  Positive for chills. Negative for fever.  HENT:  Positive for congestion, ear pain, sinus pressure and sore throat.   Eyes:  Negative for discharge and redness.  Respiratory:  Positive for cough. Negative for shortness of breath and wheezing.   Gastrointestinal:  Positive for nausea. Negative for abdominal pain, diarrhea and vomiting.  Skin:  Positive for rash.     Physical Exam Triage Vital Signs ED Triage Vitals  Enc Vitals Group     BP      Pulse      Resp      Temp      Temp src      SpO2      Weight      Height      Head Circumference  Peak Flow      Pain Score      Pain Loc      Pain Edu?      Excl. in [place]?    No data found.  Updated Vital Signs BP (!) 169/98 (BP Location: Right Arm)   Pulse 75   Temp 98 F (36.7 C) (Oral)   Resp 16   LMP 09/25/2012   SpO2 96%     Physical Exam Vitals and nursing note reviewed.  Constitutional:      General: She is not in acute distress.    Appearance: Normal appearance. She is not ill-appearing.  HENT:     Head: Normocephalic and atraumatic.     Ears:     Comments: Unable to visualize TMs bilaterally due to mild cerumen in EACs    Nose: Congestion present.     Mouth/Throat:     Mouth: Mucous membranes are moist.     Pharynx: No oropharyngeal exudate or posterior oropharyngeal erythema.  Eyes:     Conjunctiva/sclera: Conjunctivae normal.  Cardiovascular:     Rate and Rhythm: Normal rate and regular rhythm.     Heart sounds: Normal heart sounds.  No murmur heard. Pulmonary:     Effort: Pulmonary effort is normal. No respiratory distress.     Breath sounds: Normal breath sounds. No wheezing, rhonchi or rales.  Skin:    General: Skin is warm and dry.     Findings: Rash (mildly erythematous papular rash diffusely to back) present.  Neurological:     Mental Status: She is alert.  Psychiatric:        Mood and Affect: Mood normal.        Thought Content: Thought content normal.      UC Treatments / Results  Labs (all labs ordered are listed, but only abnormal results are displayed) Labs Reviewed - No data to display  EKG   Radiology No results found.  Procedures Procedures (including critical care time)  Medications Ordered in UC Medications - No data to display  Initial Impression / Assessment and Plan / UC Course  I have reviewed the triage vital signs and the nursing notes.  Pertinent labs & imaging results that were available during my care of the patient were reviewed by me and considered in my medical decision making (see chart for details).    Unknown etiology of symptoms, most likely viral in nature.  Will treat to cover possible sinusitis given duration of symptoms with Augmentin.  Encouraged follow-up if symptoms do not improve or worsen in any way.  Final Clinical Impressions(s) / UC Diagnoses   Final diagnoses:  Acute pansinusitis, recurrence not specified   Discharge Instructions   None    ED Prescriptions     Medication Sig Dispense Auth. Provider   amoxicillin-clavulanate (AUGMENTIN) 875-125 MG tablet Take 1 tablet by mouth every 12 (twelve) hours. 14 tablet Francene Finders, PA-C      PDMP not reviewed this encounter.   Francene Finders, PA-C 03/09/22 1225

## 2022-03-09 NOTE — ED Notes (Signed)
Attempt to call patient from lobby, no response.  

## 2022-11-04 ENCOUNTER — Ambulatory Visit (INDEPENDENT_AMBULATORY_CARE_PROVIDER_SITE_OTHER): Payer: Commercial Managed Care - HMO

## 2022-11-04 ENCOUNTER — Ambulatory Visit
Admission: EM | Admit: 2022-11-04 | Discharge: 2022-11-04 | Disposition: A | Discharge status: Home / Self Care | Payer: Commercial Managed Care - HMO

## 2022-11-04 DIAGNOSIS — S92425A Nondisplaced fracture of distal phalanx of left great toe, initial encounter for closed fracture: Secondary | ICD-10-CM

## 2022-11-04 DIAGNOSIS — S99922A Unspecified injury of left foot, initial encounter: Secondary | ICD-10-CM | POA: Diagnosis not present

## 2022-11-04 MED ORDER — NAPROXEN 500 MG PO TABS: 500.0000 mg | ORAL_TABLET | Freq: Two times a day (BID) | ORAL | 0 refills | Status: AC

## 2022-11-04 MED ORDER — HYDROCODONE-ACETAMINOPHEN 5-325 MG PO TABS: 1.0000 | ORAL_TABLET | Freq: Four times a day (QID) | ORAL | 0 refills | Status: AC | PRN

## 2022-11-04 NOTE — ED Triage Notes (Signed)
Pt reports she tripped and fell in the yard and scratched the right knee and injured the right big toe 1 hr ago. Pain is worse when put pressure on.

## 2022-11-04 NOTE — ED Provider Notes (Signed)
[facility]  Note:  This document was prepared using Conservation officer, historic buildings and may include unintentional dictation errors.  MRN: [id number] DOB: April 16, 1961  Subjective:   Lindsey Dorsey is a 62 y.o. female presenting for presenting for suffering a left great toe injury about an hour prior to arrival.  Patient tripped in her yard and fell on her right left great toe very awkwardly.  She is having severe pain of said toe with swelling and hot sensation.  Has never previously injured her toe.  Came straight to our clinic.  No current facility-administered medications for this encounter.  Current Outpatient Medications:    diphenhydrAMINE (BENADRYL) 50 MG capsule, Take 50 mg by mouth every 6 (six) hours as needed., Disp: , Rfl:    ALPRAZolam (XANAX) 0.5 MG tablet, Take 0.5 mg by mouth at bedtime as needed for anxiety. , Disp: , Rfl:    amoxicillin-clavulanate (AUGMENTIN) 875-125 MG tablet, Take 1 tablet by mouth every 12 (twelve) hours., Disp: 14 tablet, Rfl: 0   cyclobenzaprine (FLEXERIL) 5 MG tablet, Take 1 tablet (5 mg total) by mouth 3 (three) times daily as needed for muscle spasms., Disp: 10 tablet, Rfl: 0   fluticasone (FLONASE) 50 MCG/ACT nasal spray, Place into both nostrils daily., Disp: , Rfl:    HYDROcodone-acetaminophen (VICODIN ES) 7.5-750 MG per tablet, Take 0.5-1 tablets by mouth every 6 (six) hours as needed for pain. , Disp: , Rfl:    loratadine (CLARITIN) 10 MG tablet, Take 10 mg by mouth daily as needed for allergies., Disp: , Rfl:    methylPREDNISolone (MEDROL) 4 MG tablet, Take 2 mg by mouth daily. , Disp: , Rfl:    Multiple Vitamin (MULTIVITAMIN WITH MINERALS) TABS, Take 1 tablet by mouth daily., Disp: , Rfl:    zolpidem (AMBIEN) 10 MG tablet, Take 10 mg by mouth at bedtime as needed for sleep. , Disp: , Rfl:    No Known Allergies  Past Medical History:  Diagnosis Date   Dercum's disease      Past Surgical History:  Procedure  Laterality Date   BREAST BIOPSY      History reviewed. No pertinent family history.  Social History   Tobacco Use   Smoking status: Never  Vaping Use   Vaping Use: Never used  Substance Use Topics   Alcohol use: No   Drug use: No    ROS   Objective:   Vitals: BP (!) 144/81 (BP Location: Right Arm)   Pulse 73   Temp 99 F (37.2 C) (Oral)   Resp 18   LMP 09/25/2012   SpO2 95%   Physical Exam Constitutional:      General: She is not in acute distress.    Appearance: Normal appearance. She is well-developed. She is not ill-appearing, toxic-appearing or diaphoretic.  HENT:     Head: Normocephalic and atraumatic.     Nose: Nose normal.     Mouth/Throat:     Mouth: Mucous membranes are moist.  Eyes:     General: No scleral icterus.       Right eye: No discharge.        Left eye: No discharge.     Extraocular Movements: Extraocular movements intact.  Cardiovascular:     Rate and Rhythm: Normal rate.  Pulmonary:     Effort: Pulmonary effort is normal.  Musculoskeletal:       Feet:  Skin:    General: Skin is warm and dry.  Neurological:  General: No focal deficit present.     Mental Status: She is alert and oriented to person, place, and time.  Psychiatric:        Mood and Affect: Mood normal.        Behavior: Behavior normal.    DG Toe Great Left  Result Date: 11/04/2022 CLINICAL DATA:  Pain in the great toe. EXAM: LEFT GREAT TOE COMPARISON:  None Available. FINDINGS: Evaluation is limited due to osteopenia. There is irregularity of the base of the distal phalanx of the great toe which may be related to an old fracture or represent an acute fracture. Clinical correlation is recommended. No other acute fracture. There is no dislocation. The bones are osteopenic. Mild soft tissue swelling of the great toe. No radiopaque foreign object or soft tissue gas. IMPRESSION: Age indeterminate fracture of the base of the distal phalanx of the great toe. Correlation with  clinical exam and point tenderness recommended. Electronically Signed   By: Elgie Collard M.D.   On: 11/04/2022 19:31    Patient placed into a short leg splint with the ankle in 90 degrees flexion.  Provided with crutches.  Assessment and Plan :   PDMP not reviewed this encounter.  1. Closed nondisplaced fracture of distal phalanx of left great toe, initial encounter   2. Injury of left great toe, initial encounter    No history of a prior injury.  Recommended managing for an acute nondisplaced fracture of the distal phalanx of the left great toe as above with splinting.  Naproxen for pain control, hydrocodone for breakthrough pain.  Follow-up with podiatry soon as possible.  Counseled patient on potential for adverse effects with medications prescribed/recommended today, ER and return-to-clinic precautions discussed, patient verbalized understanding.    Wallis Bamberg, [place] 11/04/22 [phone]

## 2022-11-04 NOTE — Discharge Instructions (Addendum)
I will update your diagnosis in your medical chart once I receive the report from the radiology department.  For now from what I see, you have a fracture (broken bone) of the last part of your left great toe.  This needs to be managed with the posterior splint.  Minimize how much weight you put on the left foot.  You can move around with crutches and put weight on it as you can tolerate it based off of your pain.  However, I do not recommend excessive walking.  Use naproxen for regular pain control.  Hydrocodone can be used for breakthrough pain.  This is a narcotic pain medicine and therefore if you do not need it then do not use it.  Follow-up with the podiatrist listed on your referral.

## 2022-11-18 ENCOUNTER — Other Ambulatory Visit (HOSPITAL_COMMUNITY): Payer: Self-pay | Admitting: Obstetrics & Gynecology

## 2022-11-18 DIAGNOSIS — Z87891 Personal history of nicotine dependence: Secondary | ICD-10-CM

## 2022-11-21 ENCOUNTER — Ambulatory Visit (HOSPITAL_COMMUNITY): Payer: Commercial Managed Care - HMO

## 2022-11-21 ENCOUNTER — Encounter (HOSPITAL_COMMUNITY): Payer: Self-pay

## 2023-03-11 ENCOUNTER — Ambulatory Visit
Admission: RE | Admit: 2023-03-11 | Discharge: 2023-03-11 | Disposition: A | Discharge status: Home / Self Care | Payer: Managed Care, Other (non HMO) | Source: Ambulatory Visit | Attending: Internal Medicine | Admitting: Internal Medicine

## 2023-03-11 ENCOUNTER — Ambulatory Visit (HOSPITAL_BASED_OUTPATIENT_CLINIC_OR_DEPARTMENT_OTHER)
Admission: RE | Admit: 2023-03-11 | Discharge: 2023-03-11 | Disposition: A | Discharge status: Home / Self Care | Payer: Managed Care, Other (non HMO) | Source: Ambulatory Visit | Attending: Nurse Practitioner | Admitting: Nurse Practitioner

## 2023-03-11 VITALS — BP 162/90 | HR 76 | Temp 98.0°F | Resp 18

## 2023-03-11 DIAGNOSIS — J209 Acute bronchitis, unspecified: Secondary | ICD-10-CM | POA: Insufficient documentation

## 2023-03-11 MED ORDER — AZITHROMYCIN 250 MG PO TABS: 250.0000 mg | ORAL_TABLET | Freq: Every day | ORAL | 0 refills | Status: AC

## 2023-03-11 MED ORDER — PROMETHAZINE-DM 6.25-15 MG/5ML PO SYRP: 5.0000 mL | ORAL_SOLUTION | Freq: Four times a day (QID) | ORAL | 0 refills | Status: AC | PRN

## 2023-03-11 NOTE — ED Provider Notes (Signed)
[facility]    CSN: [id number] Arrival date & time: 03/11/23  1112      History   Chief Complaint Chief Complaint  Patient presents with   Cough    Entered by patient    HPI Lindsey Dorsey is a 62 y.o. female  presents for evaluation of URI symptoms for 7-8 days. Patient reports associated symptoms of cough, chest pain with coughing, difficulty taking a deep breath. Denies N/V/D, fevers sore throat, congestion, body aches, ear pain. Patient does not have a hx of asthma. Patient does have a history of smoking.  Reports sick contacts via grandson..  Pt has taken Mucinex OTC for symptoms. Pt has no other concerns at this time.    Cough   Past Medical History:  Diagnosis Date   Dercum's disease     There are no problems to display for this patient.   Past Surgical History:  Procedure Laterality Date   BREAST BIOPSY      OB History   No obstetric history on file.      Home Medications    Prior to Admission medications   Medication Sig Start Date End Date Taking? Authorizing Provider  azithromycin (ZITHROMAX) 250 MG tablet Take 1 tablet (250 mg total) by mouth daily. Take first 2 tablets together, then 1 every day until finished. 03/11/23  Yes Radford Pax, NP  promethazine-dextromethorphan (PROMETHAZINE-DM) 6.25-15 MG/5ML syrup Take 5 mLs by mouth 4 (four) times daily as needed for cough. 03/11/23  Yes Radford Pax, NP  ALPRAZolam Prudy Feeler) 0.5 MG tablet Take 0.5 mg by mouth at bedtime as needed for anxiety.     [provider]  amoxicillin-clavulanate (AUGMENTIN) 875-125 MG tablet Take 1 tablet by mouth every 12 (twelve) hours. 03/09/22   Tomi Bamberger, PA-C  cyclobenzaprine (FLEXERIL) 5 MG tablet Take 1 tablet (5 mg total) by mouth 3 (three) times daily as needed for muscle spasms. 05/29/14   Tilden Fossa, MD  diphenhydrAMINE (BENADRYL) 50 MG capsule Take 50 mg by mouth every 6 (six) hours as needed.    [provider]   fluticasone (FLONASE) 50 MCG/ACT nasal spray Place into both nostrils daily.    [provider]  HYDROcodone-acetaminophen (NORCO/VICODIN) 5-325 MG tablet Take 1 tablet by mouth every 6 (six) hours as needed for severe pain. 11/04/22   Wallis Bamberg, PA-C  loratadine (CLARITIN) 10 MG tablet Take 10 mg by mouth daily as needed for allergies.    [provider]  methylPREDNISolone (MEDROL) 4 MG tablet Take 2 mg by mouth daily.     [provider]  Multiple Vitamin (MULTIVITAMIN WITH MINERALS) TABS Take 1 tablet by mouth daily.    [provider]  naproxen (NAPROSYN) 500 MG tablet Take 1 tablet (500 mg total) by mouth 2 (two) times daily with a meal. 11/04/22   Wallis Bamberg, PA-C  zolpidem (AMBIEN) 10 MG tablet Take 10 mg by mouth at bedtime as needed for sleep.     [provider]    Family History History reviewed. No pertinent family history.  Social History Social History   Tobacco Use   Smoking status: Never  Vaping Use   Vaping status: Never Used  Substance Use Topics   Alcohol use: No   Drug use: No     Allergies   Patient has no known allergies.   Review of Systems Review of Systems  Respiratory:  Positive for cough.      Physical Exam Triage  Vital Signs ED Triage Vitals  Encounter Vitals Group     BP 03/11/23 1121 (!) 162/90     Systolic BP Percentile --      Diastolic BP Percentile --      Pulse Rate 03/11/23 1121 76     Resp 03/11/23 1121 18     Temp 03/11/23 1121 98 F (36.7 C)     Temp Source 03/11/23 1121 Oral     SpO2 03/11/23 1121 96 %     Weight --      Height --      Head Circumference --      Peak Flow --      Pain Score 03/11/23 1120 4     Pain Loc --      Pain Education --      Exclude from Growth Chart --    No data found.  Updated Vital Signs BP (!) 162/90 (BP Location: Right Arm)   Pulse 76   Temp 98 F (36.7 C) (Oral)   Resp 18   LMP 09/25/2012   SpO2 96%   Visual Acuity Right Eye  Distance:   Left Eye Distance:   Bilateral Distance:    Right Eye Near:   Left Eye Near:    Bilateral Near:     Physical Exam Vitals and nursing note reviewed.  Constitutional:      General: She is not in acute distress.    Appearance: She is well-developed. She is not ill-appearing.  HENT:     Head: Normocephalic and atraumatic.     Right Ear: Tympanic membrane and ear canal normal.     Left Ear: Tympanic membrane and ear canal normal.     Nose: No congestion or rhinorrhea.     Mouth/Throat:     Mouth: Mucous membranes are moist.     Pharynx: Oropharynx is clear. Uvula midline. No oropharyngeal exudate or posterior oropharyngeal erythema.     Tonsils: No tonsillar exudate or tonsillar abscesses.  Eyes:     Conjunctiva/sclera: Conjunctivae normal.     Pupils: Pupils are equal, round, and reactive to light.  Cardiovascular:     Rate and Rhythm: Normal rate and regular rhythm.     Heart sounds: Normal heart sounds.  Pulmonary:     Effort: Pulmonary effort is normal.     Breath sounds: Normal breath sounds.  Musculoskeletal:     Cervical back: Normal range of motion and neck supple.  Lymphadenopathy:     Cervical: No cervical adenopathy.  Skin:    General: Skin is warm and dry.  Neurological:     General: No focal deficit present.     Mental Status: She is alert and oriented to person, place, and time.  Psychiatric:        Mood and Affect: Mood normal.        Behavior: Behavior normal.      UC Treatments / Results  Labs (all labs ordered are listed, but only abnormal results are displayed) Labs Reviewed - No data to display  EKG   Radiology No results found.  Procedures Procedures (including critical care time)  Medications Ordered in UC Medications - No data to display  Initial Impression / Assessment and Plan / UC Course  I have reviewed the triage vital signs and the nursing notes.  Pertinent labs & imaging results that were available during my care  of the patient were reviewed by me and considered in my medical decision making (Lindsey chart for  details).     Reviewed exam and symptoms with patient.  No red flags.  Start Zithromax and Promethazine DM.  Side effect profile reviewed.  Patient requested chest x-ray.  No x-ray available onsite at time of evaluation, patient will have done outpatient and I will contact her for any abnormal results with radiology overread.  PCP follow-up 2 to 3 days for recheck.  ER precautions reviewed. Final Clinical Impressions(s) / UC Diagnoses   Final diagnoses:  Acute bronchitis, unspecified organism     Discharge Instructions      The clinic will contact you with results of your chest x-ray is abnormal.  Please start azithromycin as prescribed.  Promethazine DM as needed for cough.  Patient's medication can make you drowsy.  Do not drink alcohol or drive on this medication.  Lots of rest and fluids.  Please follow-up with your PCP in 2 days for recheck.  Please go to the ER if you develop any worsening symptoms.  I hope you feel better soon!    ED Prescriptions     Medication Sig Dispense Auth. Provider   azithromycin (ZITHROMAX) 250 MG tablet Take 1 tablet (250 mg total) by mouth daily. Take first 2 tablets together, then 1 every day until finished. 6 tablet Radford Pax, NP   promethazine-dextromethorphan (PROMETHAZINE-DM) 6.25-15 MG/5ML syrup Take 5 mLs by mouth 4 (four) times daily as needed for cough. 118 mL Radford Pax, NP      PDMP not reviewed this encounter.   Radford Pax, NP 03/11/23 1146

## 2023-03-11 NOTE — ED Triage Notes (Addendum)
Pt presents with c/o cough that has caused chest pain X 2 days. Pt states the "pain is nothing major or painful".

## 2023-03-11 NOTE — Discharge Instructions (Addendum)
The clinic will contact you with results of your chest x-ray is abnormal.  Please start azithromycin as prescribed.  Promethazine DM as needed for cough.  Patient's medication can make you drowsy.  Do not drink alcohol or drive on this medication.  Lots of rest and fluids.  Please follow-up with your PCP in 2 days for recheck.  Please go to the ER if you develop any worsening symptoms.  I hope you feel better soon!

## 2023-05-04 ENCOUNTER — Encounter: Payer: Self-pay | Admitting: Dermatology

## 2023-05-04 ENCOUNTER — Ambulatory Visit: Payer: Commercial Managed Care - HMO | Admitting: Dermatology

## 2023-05-04 VITALS — BP 145/70 | HR 64

## 2023-05-04 DIAGNOSIS — Z808 Family history of malignant neoplasm of other organs or systems: Secondary | ICD-10-CM

## 2023-05-04 DIAGNOSIS — C44311 Basal cell carcinoma of skin of nose: Secondary | ICD-10-CM | POA: Diagnosis not present

## 2023-05-04 DIAGNOSIS — L409 Psoriasis, unspecified: Secondary | ICD-10-CM | POA: Diagnosis not present

## 2023-05-04 DIAGNOSIS — Z85828 Personal history of other malignant neoplasm of skin: Secondary | ICD-10-CM

## 2023-05-04 DIAGNOSIS — C44310 Basal cell carcinoma of skin of unspecified parts of face: Secondary | ICD-10-CM

## 2023-05-04 MED ORDER — CLOBETASOL PROPIONATE 0.05 % EX OINT: 1.0000 | TOPICAL_OINTMENT | Freq: Two times a day (BID) | CUTANEOUS | 0 refills | Status: AC

## 2023-05-04 NOTE — Patient Instructions (Signed)
 Skin Education :   I counseled the patient regarding the following: Sun screen (SPF 30 or greater) should be applied during peak UV exposure (between 10am and 2pm) and reapplied after exercise or swimming.  The ABCDEs of melanoma were reviewed with the patient, and the importance of monthly self-examination of moles was emphasized. Should any moles change in shape or color, or itch, bleed or burn, pt will contact our office for evaluation sooner then their interval appointment.  Plan: Sunscreen Recommendations I recommended a broad spectrum sunscreen with a SPF of 30 or higher. I explained that SPF 30 sunscreens block approximately 97 percent of the sun's harmful rays. Sunscreens should be applied at least 15 minutes prior to expected sun exposure and then every 2 hours after that as long as sun exposure continues. If swimming or exercising sunscreen should be reapplied every 45 minutes to an hour after getting wet or sweating. One ounce, or the equivalent of a shot glass full of sunscreen, is adequate to protect the skin not covered by a bathing suit. I also recommended a lip balm with a sunscreen as well. Sun protective clothing can be used in lieu of sunscreen but must be worn the entire time you are exposed to the sun's rays.  Important Information  Due to recent changes in healthcare laws, you may see results of your pathology and/or laboratory studies on MyChart before the doctors have had a chance to review them. We understand that in some cases there may be results that are confusing or concerning to you. Please understand that not all results are received at the same time and often the doctors may need to interpret multiple results in order to provide you with the best plan of care or course of treatment. Therefore, we ask that you please give [place] 2 business days to thoroughly review all your results before contacting the office for clarification. Should we see a critical lab result, you will be  contacted sooner.   If You Need Anything After Your Visit  If you have any questions or concerns for your doctor, please call our main line at [phone] If no one answers, please leave a voicemail as directed and we will return your call as soon as possible. Messages left after 4 pm will be answered the following business day.   You may also send [place] a message via MyChart. We typically respond to MyChart messages within 1-2 business days.  For prescription refills, please ask your pharmacy to contact our office. Our fax number is [phone].  If you have an urgent issue when the clinic is closed that cannot wait until the next business day, you can page your doctor at the number below.    Please note that while we do our best to be available for urgent issues outside of office hours, we are not available 24/7.   If you have an urgent issue and are unable to reach [place], you may choose to seek medical care at your doctor's office, retail clinic, urgent care center, or emergency room.  If you have a medical emergency, please immediately call 911 or go to the emergency department. In the event of inclement weather, please call our main line at [phone] for an update on the status of any delays or closures.  Dermatology Medication Tips: Please keep the boxes that topical medications come in in order to help keep track of the instructions about where and how to use these. Pharmacies typically print the medication instructions  only on the boxes and not directly on the medication tubes.   If your medication is too expensive, please contact our office at [phone] or send [place] a message through MyChart.   We are unable to tell what your co-pay for medications will be in advance as this is different depending on your insurance coverage. However, we may be able to find a substitute medication at lower cost or fill out paperwork to get insurance to cover a needed medication.   If a prior  authorization is required to get your medication covered by your insurance company, please allow [place] 1-2 business days to complete this process.  Drug prices often vary depending on where the prescription is filled and some pharmacies may offer cheaper prices.  The website www.goodrx.com contains coupons for medications through different pharmacies. The prices here do not account for what the cost may be with help from insurance (it may be cheaper with your insurance), but the website can give you the price if you did not use any insurance.  - You can print the associated coupon and take it with your prescription to the pharmacy.  - You may also stop by our office during regular business hours and pick up a GoodRx coupon card.  - If you need your prescription sent electronically to a different pharmacy, notify our office through [facility] or by phone at [phone]

## 2023-05-04 NOTE — Progress Notes (Signed)
   New Patient Visit   Subjective  Lindsey Dorsey is a 62 y.o. female who presents for the following:  Spot of concern on right knee. HX BCC.  Family hx of BCC. Lesion on knee has been present for at least 6 months, never treated with Rx meds. Itchy at times.   The patient has spots, moles and lesions to be evaluated, some may be new or changing and the patient may have concern these could be cancer.   The following portions of the chart were reviewed this encounter and updated as appropriate: medications, allergies, medical history  Review of Systems:  No other skin or systemic complaints except as noted in HPI or Assessment and Plan.  Objective  Well appearing patient in no apparent distress; mood and affect are within normal limits.   A focused examination was performed of the following areas:  Right knee  Relevant exam findings are noted in the Assessment and Plan.    Assessment & Plan   PSORIASIS- right knee, left lower leg Exam: Well-demarcated erythematous papules/plaques with silvery scale, guttate pink scaly papules. <5% BSA.  wellcontrolled   Psoriasis is a chronic non-curable, but treatable genetic/hereditary disease that may have other systemic features affecting other organ systems such as joints (Psoriatic Arthritis). It is associated with an increased risk of inflammatory bowel disease, heart disease, non-alcoholic fatty liver disease, and depression.  Treatments include light and laser treatments; topical medications; and systemic medications including oral and injectables.  Treatment Plan: Clobetasol ointment - clobetasol ointment (TEMOVATE) 0.05 %; Apply 1 Application topically 2 (two) times daily.  Dispense: 60 g; Refill: 0  Basal Cell Carcinoma- Right nasal bridge/root - Bxed by skin surgery center, recommend proceeding with Mohs surgery for removal  Return for TBSE.  I, Tillie Fantasia, CMA, am acting as scribe for Gwenith Daily, MD.    Documentation: I have reviewed the above documentation for accuracy and completeness, and I agree with the above.  Gwenith Daily, MD

## 2023-06-03 ENCOUNTER — Ambulatory Visit: Payer: Commercial Managed Care - HMO | Admitting: Dermatology

## 2023-07-15 ENCOUNTER — Ambulatory Visit: Payer: Commercial Managed Care - HMO | Admitting: Dermatology

## 2023-09-22 IMAGING — DX DG ANKLE COMPLETE 3+V*R*
3 series · 3 of 3 positions shown · non-contrast
Comparison: 06/07/2014

CLINICAL DATA: Right ankle pain and swelling

EXAM:
RIGHT ANKLE - COMPLETE 3+ VIEW

[dg ankle complete right (1 of 3)]
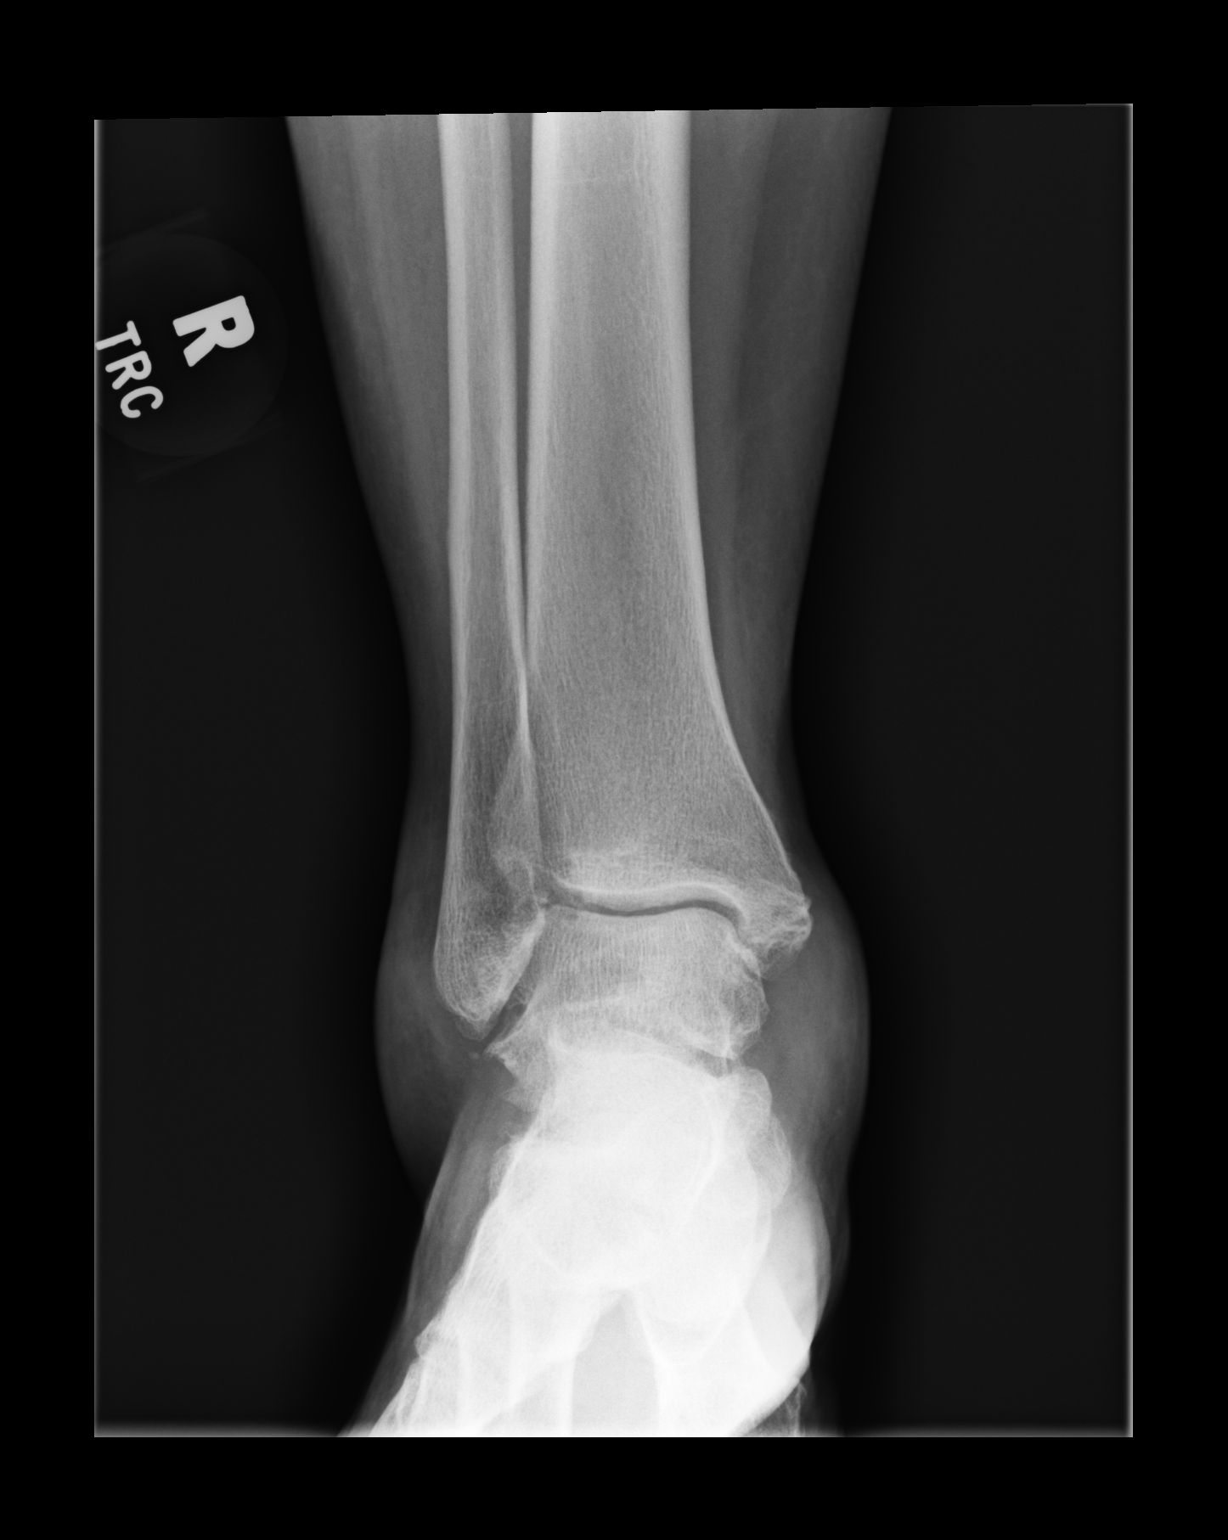

[dg ankle complete right (2 of 3)]
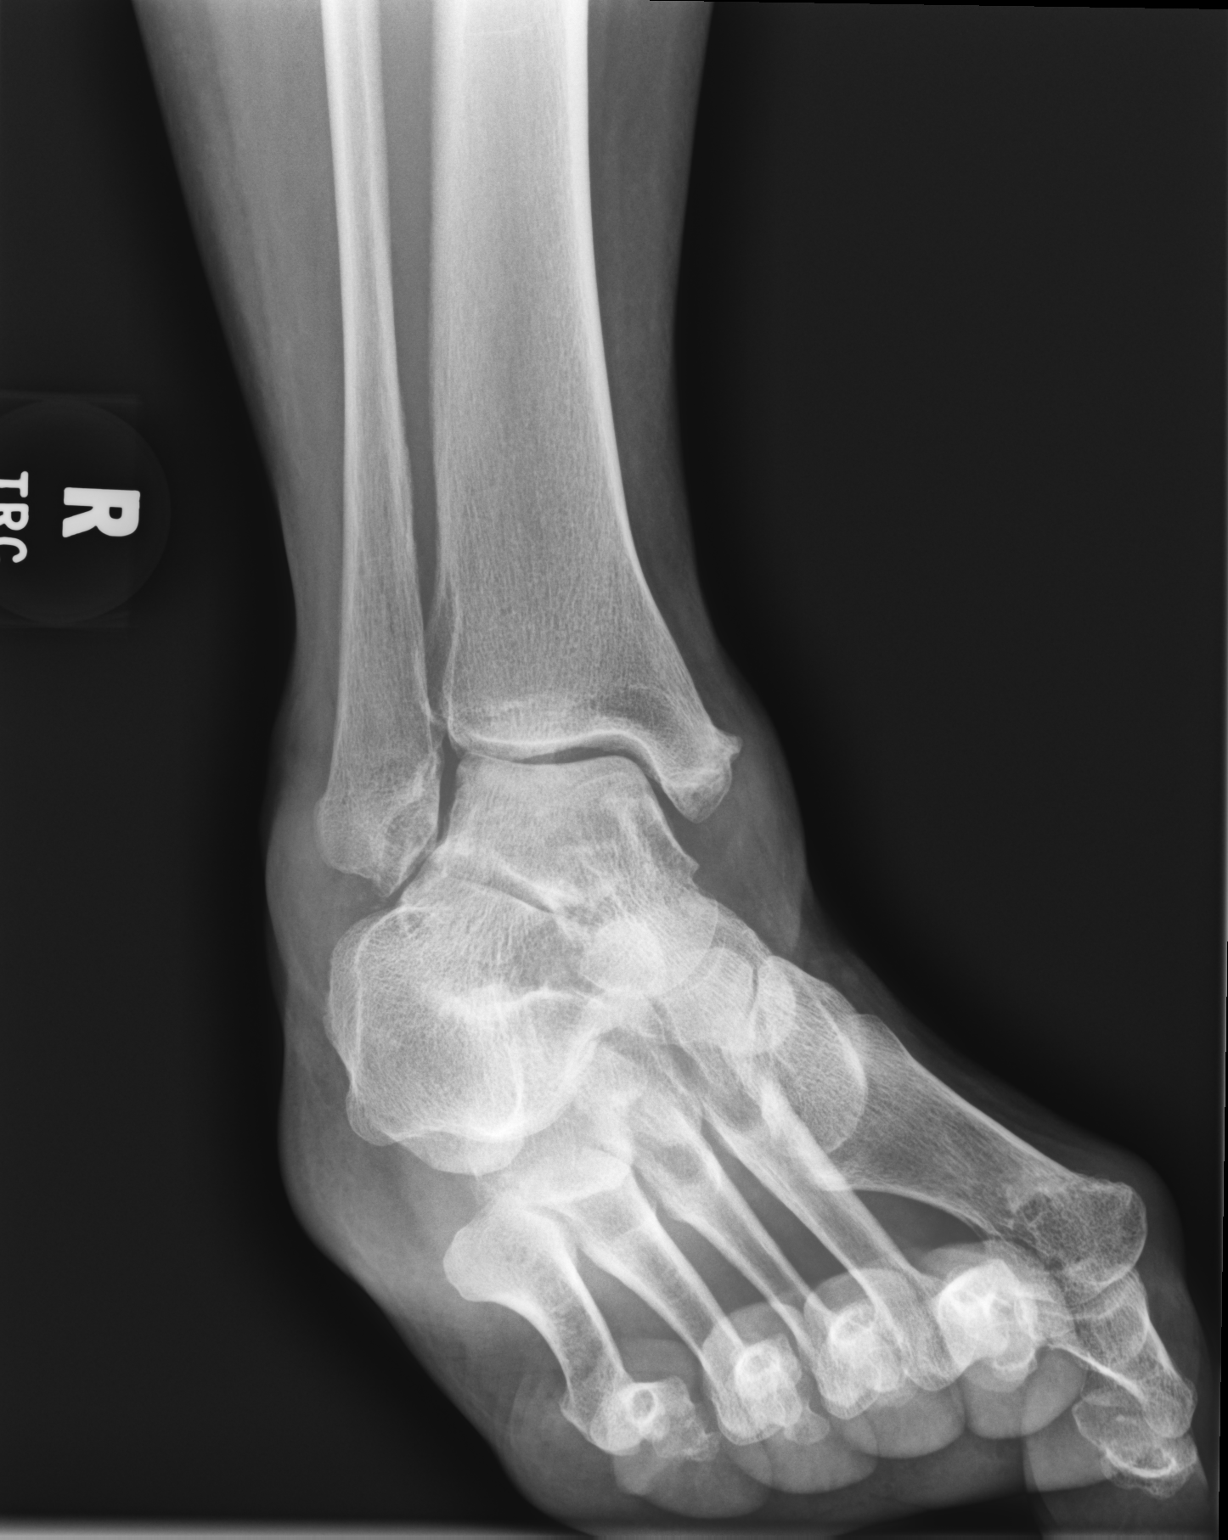

[dg ankle complete right (3 of 3)]
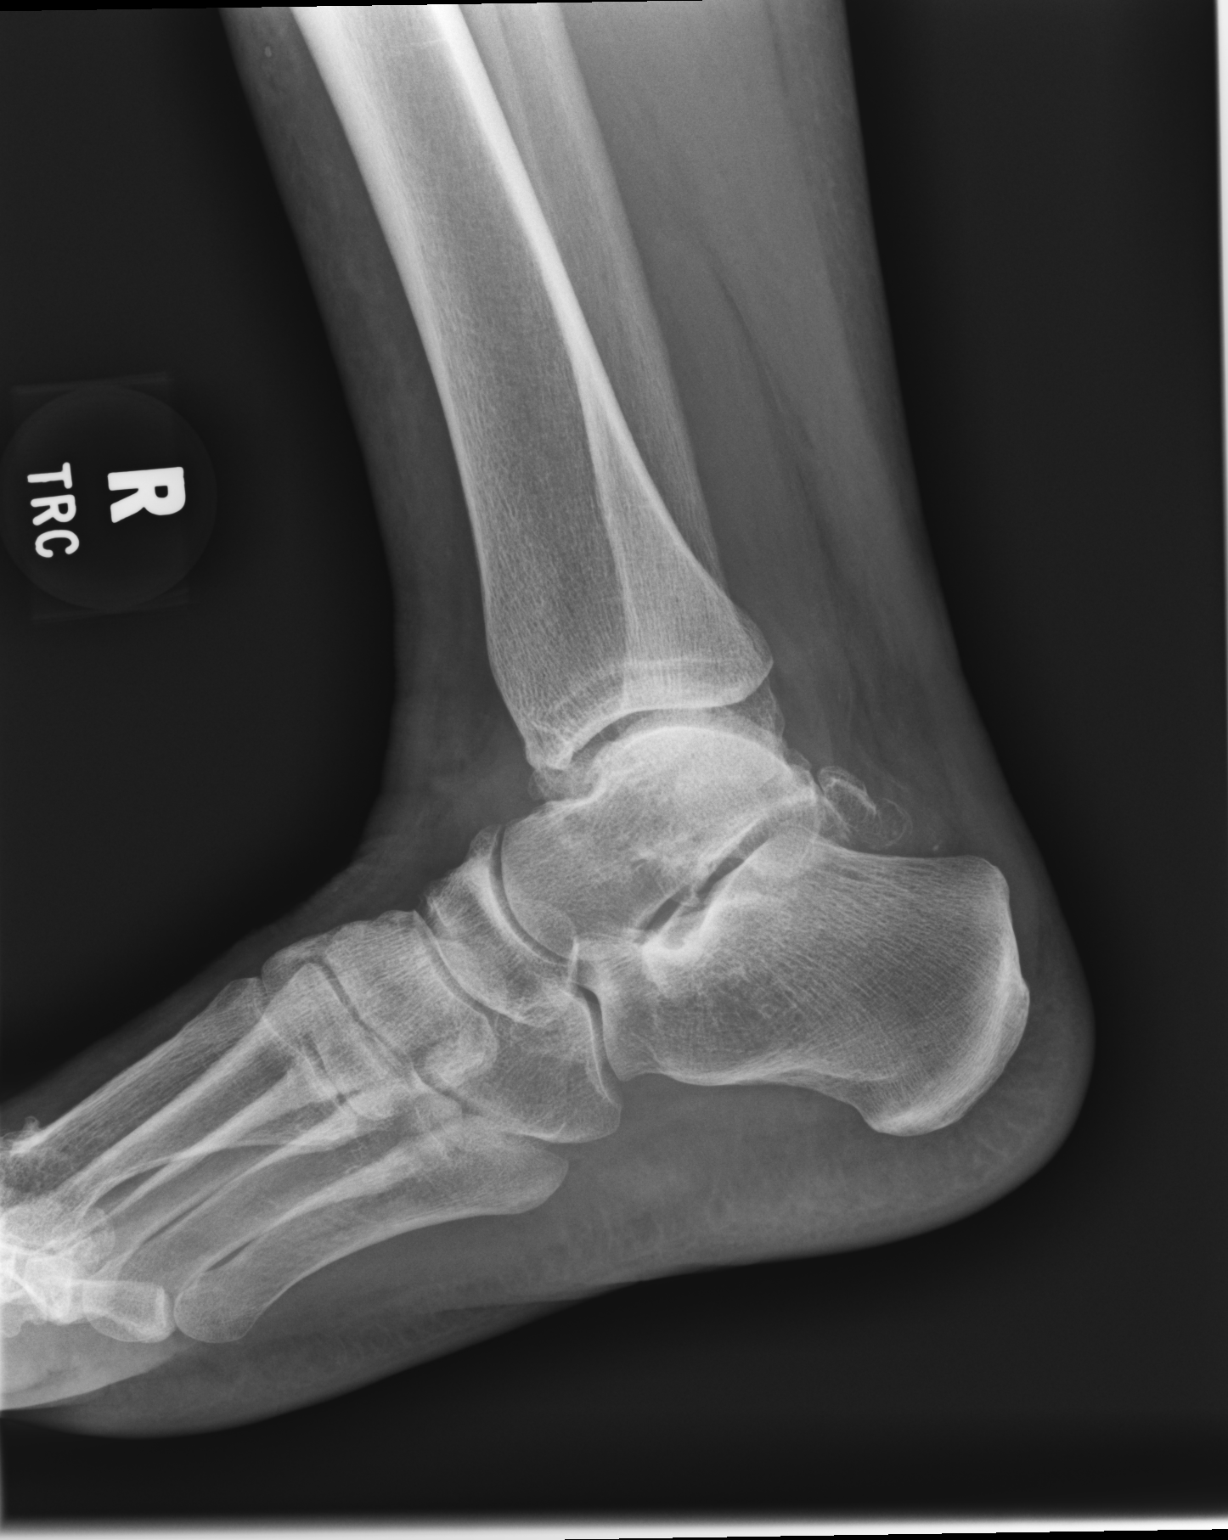

[3 of 3 positions shown; findings below may reference images not displayed]

FINDINGS: Diffuse ankle soft tissue swelling. Interval progression of the
right ankle joint mild degenerative arthropathy with joint space
loss, areas of sclerosis and bony spurring. Malleoli, talus, and
calcaneus appear intact. No subluxation or dislocation.
IMPRESSION: Interval progressive right ankle degenerative arthropathy.
Associated soft tissue swelling.

No acute finding by plain radiography.

## 2024-03-08 DIAGNOSIS — H0100B Unspecified blepharitis left eye, upper and lower eyelids: Secondary | ICD-10-CM | POA: Diagnosis not present

## 2024-03-08 DIAGNOSIS — H25813 Combined forms of age-related cataract, bilateral: Secondary | ICD-10-CM | POA: Diagnosis not present

## 2024-03-08 DIAGNOSIS — H0100A Unspecified blepharitis right eye, upper and lower eyelids: Secondary | ICD-10-CM | POA: Diagnosis not present

## 2024-03-08 DIAGNOSIS — H40013 Open angle with borderline findings, low risk, bilateral: Secondary | ICD-10-CM | POA: Diagnosis not present

## 2024-03-21 DIAGNOSIS — Z1231 Encounter for screening mammogram for malignant neoplasm of breast: Secondary | ICD-10-CM | POA: Diagnosis not present

## 2024-03-21 DIAGNOSIS — R1032 Left lower quadrant pain: Secondary | ICD-10-CM | POA: Diagnosis not present

## 2024-03-21 DIAGNOSIS — Z01419 Encounter for gynecological examination (general) (routine) without abnormal findings: Secondary | ICD-10-CM | POA: Diagnosis not present

## 2024-04-05 DIAGNOSIS — Z1211 Encounter for screening for malignant neoplasm of colon: Secondary | ICD-10-CM | POA: Diagnosis not present
